# Patient Record
Sex: Male | Born: 2005 | Race: Black or African American | Hispanic: No | Marital: Single
Health system: Southern US, Community
[De-identification: ages and names within clinical notes are randomized; demographics above are authoritative.]

## PROBLEM LIST (undated history)

## (undated) DIAGNOSIS — L309 Dermatitis, unspecified: Secondary | ICD-10-CM

---
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
## 2024-02-25 ENCOUNTER — Emergency Department (HOSPITAL_COMMUNITY)
Admission: EM | Admit: 2024-02-25 | Discharge: 2024-02-25 | Disposition: A | Discharge status: Home / Self Care | Payer: Self-pay | Attending: Emergency Medicine | Admitting: Emergency Medicine

## 2024-02-25 DIAGNOSIS — W268XXA Contact with other sharp object(s), not elsewhere classified, initial encounter: Secondary | ICD-10-CM | POA: Diagnosis not present

## 2024-02-25 DIAGNOSIS — S61011A Laceration without foreign body of right thumb without damage to nail, initial encounter: Secondary | ICD-10-CM | POA: Diagnosis present

## 2024-02-25 DIAGNOSIS — S61012A Laceration without foreign body of left thumb without damage to nail, initial encounter: Secondary | ICD-10-CM

## 2024-02-25 DIAGNOSIS — Z23 Encounter for immunization: Secondary | ICD-10-CM | POA: Diagnosis not present

## 2024-02-25 DIAGNOSIS — R030 Elevated blood-pressure reading, without diagnosis of hypertension: Secondary | ICD-10-CM

## 2024-02-25 DIAGNOSIS — Y9389 Activity, other specified: Secondary | ICD-10-CM | POA: Diagnosis not present

## 2024-02-25 MED ORDER — TETANUS-DIPHTH-ACELL PERTUSSIS 5-2-15.5 LF-MCG/0.5 IM SUSP
0.5000 mL | Freq: Once | INTRAMUSCULAR | Status: AC
  Administered 2024-02-25: 0.5 mL via INTRAMUSCULAR
  Filled 2024-02-25: qty 0.5

## 2024-02-25 MED ORDER — LIDOCAINE-EPINEPHRINE (PF) 2 %-1:200000 IJ SOLN
20.0000 mL | Freq: Once | INTRAMUSCULAR | Status: AC
  Administered 2024-02-25: 20 mL
  Filled 2024-02-25: qty 20

## 2024-02-25 NOTE — ED Provider Notes (Signed)
 [place] [facility] AT [facility] Provider Note   CSN: [id number] Arrival date & time: 02/25/24  9174     Patient presents with: No chief complaint on file.   Shane Ray is a 18 y.o. male.   Pt c/o laceration to  pad of left thumb this AM. Was opening can and accidentally cut thumb. Is right hand dom. Denies other pain or injury. No anticoag use. No numbness or loss of normal movement. Unsure of last tetanus.   The history is provided by the patient and medical records.       Prior to Admission medications   Medication Sig Start Date End Date Taking? Authorizing Provider  albuterol  (PROVENTIL  HFA;VENTOLIN  HFA) 108 (90 BASE) MCG/ACT inhaler Inhale 2 puffs into the lungs every 4 (four) hours as needed for wheezing. 02/28/12   Willaim Darnel, MD  albuterol  (PROVENTIL ) (2.5 MG/3ML) 0.083% nebulizer solution Take 3 mLs (2.5 mg total) by nebulization every 6 (six) hours as needed for wheezing. 08/13/12   Ettie Gull, MD  cetirizine  (ZYRTEC  ALLERGY) 10 MG tablet Take 1 tablet (10 mg total) by mouth daily. 07/17/21   Neldon Hamp RAMAN, PA  cetirizine  (ZYRTEC ) 1 MG/ML syrup Take 5 mg by mouth daily.    [provider]  ibuprofen  (ADVIL ,MOTRIN ) 100 MG/5ML suspension Take 18.7 mLs (374 mg total) by mouth every 6 (six) hours as needed for fever or mild pain. 02/22/14   Rhae Lye, MD  predniSONE  (DELTASONE ) 20 MG tablet 2 tabs po daily x 4 days 08/12/22   Freddi Hamilton, MD    Allergies: Patient has no known allergies.    Review of Systems  Constitutional:  Negative for fever.  Musculoskeletal:        Thumb lac.   Skin:  Positive for wound.  Neurological:  Negative for weakness and numbness.    Updated Vital Signs BP (!) 154/105 (BP Location: Left Arm)   Pulse (!) 104   Temp 98.1 F (36.7 C) (Oral)   Resp 18   SpO2 99%   Physical Exam Vitals and nursing note reviewed.  Constitutional:      Appearance: Normal appearance. He is well-developed.   HENT:     Head: Atraumatic.     Nose: Nose normal.     Mouth/Throat:     Mouth: Mucous membranes are moist.  Eyes:     General: No scleral icterus.    Conjunctiva/sclera: Conjunctivae normal.  Neck:     Trachea: No tracheal deviation.  Cardiovascular:     Rate and Rhythm: Normal rate.     Pulses: Normal pulses.  Pulmonary:     Effort: Pulmonary effort is normal. No accessory muscle usage or respiratory distress.  Musculoskeletal:        General: No swelling.     Comments: 1.5 cm laceration to palmar aspect of distal phalanx of left thumb.   Skin:    General: Skin is warm and dry.     Findings: No rash.  Neurological:     Mental Status: He is alert.     Comments: Alert, speech clear. Left hand/thumb nvi.   Psychiatric:        Mood and Affect: Mood normal.     (all labs ordered are listed, but only abnormal results are displayed) Labs Reviewed - No data to display  EKG: None  Radiology: No results found.   .Laceration Repair  Date/Time: 02/25/2024 9:38 AM  Performed by: Bernard Drivers, MD Authorized by: Bernard Drivers, MD  Consent:    Consent given by:  Patient Anesthesia:    Anesthesia method:  Local infiltration   Local anesthetic:  Lidocaine  1% w/o epi Laceration details:    Location:  Finger   Finger location:  L thumb   Length (cm):  1.5 Pre-procedure details:    Preparation:  Patient was prepped and draped in usual sterile fashion Exploration:    Imaging outcome: foreign body not noted     Wound extent: no foreign body     Contaminated: no   Treatment:    Area cleansed with:  Saline   Irrigation solution:  Sterile saline   Irrigation volume:  250 cc Skin repair:    Repair method:  Sutures   Suture size:  4-0   Suture material:  Prolene   Suture technique:  Simple interrupted   Number of sutures:  5 Repair type:    Repair type:  Simple Post-procedure details:    Dressing:  Sterile dressing   Procedure completion:  Tolerated well, no  immediate complications    Medications Ordered in the ED  lidocaine -EPINEPHrine  (XYLOCAINE  W/EPI) 2 %-1:200000 (PF) injection 20 mL (has no administration in time range)  Tdap (ADACEL ) injection 0.5 mL (has no administration in time range)                                    Medical Decision Making Problems Addressed: Elevated blood pressure reading: acute illness or injury Laceration of left thumb, initial encounter: acute illness or injury  Amount and/or Complexity of Data Reviewed External Data Reviewed: notes.  Risk Prescription drug management.   Tetanus IM.  Reviewed nursing notes and prior charts for additional history.   Lac repair. Sterile dressing.   Recheck, no pain or discomfort.   Discussed elevated bp w pt - pt indicates was very worried about lac/shots/etc. - indicates has been asymptomatic, no hx same.    Rec pcp f/u.        Final diagnoses:  None    ED Discharge Orders     None          Bernard Drivers, MD 02/25/24 [phone]

## 2024-02-25 NOTE — Discharge Instructions (Addendum)
 It was our pleasure to provide your ER care today - we hope that you feel better.  Keep wound very clean and dry.   Have sutures removed, your doctor or urgent care, in 10-12 days.   Your blood pressure is high today - follow up with primary care doctor in one week for recheck.  Return to ER if worse, new symptoms, infection of wound, severe pain, or other concern.

## 2024-02-25 NOTE — ED Triage Notes (Signed)
 Patient cut his left thumb opening a can this morning. Unsure of when he had his last tetanus shot. Rates pain 4/10.
